# Patient Record
Sex: Male | Born: 1985 | Race: Black or African American | Hispanic: No | Marital: Married | Smoking: Never smoker
Health system: Southern US, Community
[De-identification: ages and names within clinical notes are randomized; demographics above are authoritative.]

---

## 2005-08-11 ENCOUNTER — Emergency Department (HOSPITAL_COMMUNITY): Admission: EM | Admit: 2005-08-11 | Discharge: 2005-08-11 | Payer: Self-pay | Admitting: Emergency Medicine

## 2017-05-09 ENCOUNTER — Telehealth: Payer: Self-pay | Admitting: Behavioral Health

## 2017-05-09 NOTE — Telephone Encounter (Signed)
Unable to reach patient at time of Pre-Visit Call.  Left message for patient to return call when available.    

## 2017-05-10 ENCOUNTER — Ambulatory Visit: Payer: Self-pay | Admitting: Family Medicine

## 2017-05-10 DIAGNOSIS — Z0289 Encounter for other administrative examinations: Secondary | ICD-10-CM

## 2019-03-18 ENCOUNTER — Ambulatory Visit (INDEPENDENT_AMBULATORY_CARE_PROVIDER_SITE_OTHER): Payer: Managed Care, Other (non HMO) | Admitting: Nurse Practitioner

## 2019-03-18 ENCOUNTER — Encounter: Payer: Self-pay | Admitting: Nurse Practitioner

## 2019-03-18 ENCOUNTER — Other Ambulatory Visit: Payer: Self-pay

## 2019-03-18 VITALS — Ht 70.0 in | Wt 325.0 lb

## 2019-03-18 DIAGNOSIS — R059 Cough, unspecified: Secondary | ICD-10-CM

## 2019-03-18 DIAGNOSIS — Z7189 Other specified counseling: Secondary | ICD-10-CM | POA: Diagnosis not present

## 2019-03-18 DIAGNOSIS — R05 Cough: Secondary | ICD-10-CM | POA: Diagnosis not present

## 2019-03-18 MED ORDER — LORATADINE 10 MG PO TABS: 10.0000 mg | ORAL_TABLET | Freq: Every day | ORAL | 0 refills | Status: AC | PRN

## 2019-03-18 NOTE — Progress Notes (Signed)
This visit type was conducted due to national recommendations for restrictions regarding the COVID-19 Pandemic (e.g. social distancing). This format is felt to be most appropriate for this patient at this time.  All issues noted in this document were discussed and addressed.  No physical exam was performed (except for noted visual exam findings with Video Visits).  Please refer to the patient's chart (MyChart message for video visits and phone note for telephone visits) for the patient's consent to telehealth for [facility].   Subjective:     Patient ID: Jeremy Beck , male    DOB: 14-Dec-1985 , 33 y.o.   MRN: [id number]  Virtual Visit via [email or web address] Note  I connected with Armandina Gemma on 03/18/19 at  4:00 PM EDT by telephone and verified that I am speaking with the correct person using two identifiers.   I discussed the limitations, risks, security and privacy concerns of performing an evaluation and management service by video and the availability of in person appointments. I also discussed with the patient that there may be a patient responsible charge related to this service. The patient expressed understanding and agreed to proceed.  Patient Location:  Home Provider Location: Home  Chief Complaint  Patient presents with  . Cough    History of Present Illness:   Installs cable.    Cough  This is a new problem. The current episode started in the past 7 days. The cough is productive of purulent sputum. Associated symptoms include chills. Pertinent negatives include no chest pain, fever, headaches, sore throat, shortness of breath or wheezing. The treatment provided mild relief. His past medical history is significant for environmental allergies. There is no history of asthma.     No past medical history on file.   Family History  Problem Relation Age of Onset  . Diabetes Mother     No current outpatient medications on file.   Not on File   Review of Systems  Constitutional:  Positive for chills. Negative for fatigue and fever.  HENT: Negative for sore throat.   Respiratory: Positive for cough. Negative for shortness of breath and wheezing.   Cardiovascular: Negative for chest pain, palpitations and leg swelling.  Gastrointestinal: Negative for diarrhea, nausea and vomiting.  Skin: Negative.   Allergic/Immunologic: Positive for environmental allergies.  Neurological: Negative for dizziness and headaches.     Today's Vitals   03/18/19 1610  Weight: (!) 325 lb (147.4 kg)  Height: 5\' 10"  (1.778 m)    Observations/Objective: No acute respiratory distress.  Skin appears dry. Unable to do full head to toe due to video virtual visit       Assessment and Plan: 1. Cough  Due to the current COVID-19 pandemic and him having an intermittent chills I feel he needs to quarantine for 14 days or until he has been without symptoms for at least 3 days - loratadine (CLARITIN) 10 MG tablet; Take 1 tablet (10 mg total) by mouth daily as needed for allergies.  Dispense: 90 tablet; Refill: 0   Follow Up Instructions:  Work note to be out for 14 days  Return call to office if symptoms worsen.  I discussed the assessment and treatment plan with the patient. The patient was provided an opportunity to ask questions and all were answered. The patient agreed with the plan and demonstrated an understanding of the instructions.   The patient was advised to call back or seek an in-person evaluation if the symptoms worsen or if the condition fails  to improve as anticipated.  COVID-19 Education: The signs and symptoms of COVID-19 were discussed with the patient and how to seek care for testing (follow up with PCP or arrange E-visit).  The importance of social distancing was discussed today.   Patient Risk:   After full review of this patients clinical status, I feel that they are at least moderate risk at this time.   I provided 12 minutes of non-face-to-face time during this  encounter.   Arnette FeltsJanece Ameliarose Shark, FNP

## 2020-09-01 ENCOUNTER — Other Ambulatory Visit: Payer: Self-pay

## 2020-09-01 DIAGNOSIS — Z20822 Contact with and (suspected) exposure to covid-19: Secondary | ICD-10-CM

## 2020-09-02 LAB — NOVEL CORONAVIRUS, NAA: SARS-CoV-2, NAA: NOT DETECTED

## 2020-09-02 LAB — SARS-COV-2, NAA 2 DAY TAT

## 2020-12-08 ENCOUNTER — Other Ambulatory Visit: Payer: Self-pay

## 2020-12-08 DIAGNOSIS — Z20822 Contact with and (suspected) exposure to covid-19: Secondary | ICD-10-CM

## 2020-12-10 LAB — NOVEL CORONAVIRUS, NAA: SARS-CoV-2, NAA: NOT DETECTED

## 2020-12-10 LAB — SARS-COV-2, NAA 2 DAY TAT

## 2021-05-30 ENCOUNTER — Other Ambulatory Visit: Payer: Self-pay

## 2021-05-30 ENCOUNTER — Encounter (HOSPITAL_BASED_OUTPATIENT_CLINIC_OR_DEPARTMENT_OTHER): Payer: Self-pay | Admitting: Emergency Medicine

## 2021-05-30 ENCOUNTER — Emergency Department (HOSPITAL_BASED_OUTPATIENT_CLINIC_OR_DEPARTMENT_OTHER)
Admission: EM | Admit: 2021-05-30 | Discharge: 2021-05-30 | Disposition: A | Discharge status: Home / Self Care | Payer: BC Managed Care – PPO | Attending: Emergency Medicine | Admitting: Emergency Medicine

## 2021-05-30 ENCOUNTER — Emergency Department (HOSPITAL_BASED_OUTPATIENT_CLINIC_OR_DEPARTMENT_OTHER): Payer: BC Managed Care – PPO

## 2021-05-30 DIAGNOSIS — R109 Unspecified abdominal pain: Secondary | ICD-10-CM | POA: Insufficient documentation

## 2021-05-30 DIAGNOSIS — R300 Dysuria: Secondary | ICD-10-CM | POA: Diagnosis not present

## 2021-05-30 DIAGNOSIS — M545 Low back pain, unspecified: Secondary | ICD-10-CM | POA: Insufficient documentation

## 2021-05-30 LAB — CBC WITH DIFFERENTIAL/PLATELET
Abs Immature Granulocytes: 0.04 10*3/uL (ref 0.00–0.07)
Basophils Absolute: 0 10*3/uL (ref 0.0–0.1)
Basophils Relative: 0 %
Eosinophils Absolute: 0.1 10*3/uL (ref 0.0–0.5)
Eosinophils Relative: 1 %
HCT: 39.1 % (ref 39.0–52.0)
Hemoglobin: 13.1 g/dL (ref 13.0–17.0)
Immature Granulocytes: 1 %
Lymphocytes Relative: 5 %
Lymphs Abs: 0.4 10*3/uL — ABNORMAL LOW (ref 0.7–4.0)
MCH: 29.2 pg (ref 26.0–34.0)
MCHC: 33.5 g/dL (ref 30.0–36.0)
MCV: 87.3 fL (ref 80.0–100.0)
Monocytes Absolute: 1.3 10*3/uL — ABNORMAL HIGH (ref 0.1–1.0)
Monocytes Relative: 15 %
Neutro Abs: 7 10*3/uL (ref 1.7–7.7)
Neutrophils Relative %: 78 %
Platelets: 214 10*3/uL (ref 150–400)
RBC: 4.48 MIL/uL (ref 4.22–5.81)
RDW: 14.2 % (ref 11.5–15.5)
WBC: 8.8 10*3/uL (ref 4.0–10.5)
nRBC: 0 % (ref 0.0–0.2)

## 2021-05-30 LAB — URINALYSIS, ROUTINE W REFLEX MICROSCOPIC
Bilirubin Urine: NEGATIVE
Glucose, UA: NEGATIVE mg/dL
Ketones, ur: NEGATIVE mg/dL
Leukocytes,Ua: NEGATIVE
Nitrite: NEGATIVE
Protein, ur: NEGATIVE mg/dL
Specific Gravity, Urine: 1.025 (ref 1.005–1.030)
pH: 6.5 (ref 5.0–8.0)

## 2021-05-30 LAB — COMPREHENSIVE METABOLIC PANEL
ALT: 30 U/L (ref 0–44)
AST: 30 U/L (ref 15–41)
Albumin: 4.1 g/dL (ref 3.5–5.0)
Alkaline Phosphatase: 43 U/L (ref 38–126)
Anion gap: 9 (ref 5–15)
BUN: 10 mg/dL (ref 6–20)
CO2: 26 mmol/L (ref 22–32)
Calcium: 8.6 mg/dL — ABNORMAL LOW (ref 8.9–10.3)
Chloride: 98 mmol/L (ref 98–111)
Creatinine, Ser: 1.04 mg/dL (ref 0.61–1.24)
GFR, Estimated: >60 mL/min (ref >60)
Glucose, Bld: 97 mg/dL (ref 70–99)
Potassium: 3.2 mmol/L — ABNORMAL LOW (ref 3.5–5.1)
Sodium: 133 mmol/L — ABNORMAL LOW (ref 135–145)
Total Bilirubin: 0.2 mg/dL — ABNORMAL LOW (ref 0.3–1.2)
Total Protein: 7.2 g/dL (ref 6.5–8.1)

## 2021-05-30 LAB — URINALYSIS, MICROSCOPIC (REFLEX)

## 2021-05-30 MED ORDER — SODIUM CHLORIDE 0.9 % IV BOLUS
500.0000 mL | Freq: Once | INTRAVENOUS | Status: AC
  Administered 2021-05-30: 500 mL via INTRAVENOUS

## 2021-05-30 MED ORDER — MORPHINE SULFATE (PF) 4 MG/ML IV SOLN
4.0000 mg | Freq: Once | INTRAVENOUS | Status: AC
Start: 2021-05-30 — End: 2021-05-30
  Administered 2021-05-30: 4 mg via INTRAVENOUS
  Filled 2021-05-30: qty 1

## 2021-05-30 MED ORDER — POTASSIUM CHLORIDE CRYS ER 20 MEQ PO TBCR
40.0000 meq | EXTENDED_RELEASE_TABLET | Freq: Once | ORAL | Status: AC
  Administered 2021-05-30: 40 meq via ORAL
  Filled 2021-05-30: qty 2

## 2021-05-30 NOTE — ED Triage Notes (Signed)
Pt presents to ED Pov. Pt c/o L flank pain and dysuria. Pt reports that he has not been able to fully empty bladder.

## 2021-05-30 NOTE — ED Notes (Signed)
Unable to provide urine specimen at this time. Strainer at bedside.

## 2021-05-30 NOTE — ED Provider Notes (Signed)
[facility] EMERGENCY DEPARTMENT Provider Note   CSN: [id number] Arrival date & time: 05/30/21  1640     History Chief Complaint  Patient presents with   Flank Pain    Jeremy Beck is a 35 y.o. male.  HPI Patient is a 35 year old male with no pertinent medical history presents to the emergency department with sudden onset left flank pain that started about 12 hours ago.  Symptoms have been progressively worsening throughout the day.  Reports associated left-sided abdominal pain.  No nausea, vomiting, diarrhea, or fevers.  He does note some mild dysuria as well as difficulty with urination since his symptoms started.  No other complaints.    History reviewed. No pertinent past medical history.  There are no problems to display for this patient.   History reviewed. No pertinent surgical history.     Family History  Problem Relation Age of Onset   Diabetes Mother     Social History   Tobacco Use   Smoking status: Never   Smokeless tobacco: Never  Substance Use Topics   Alcohol use: Yes    Comment: occasionally    Drug use: Never    Home Medications Prior to Admission medications   Medication Sig Start Date End Date Taking? Authorizing Provider  loratadine (CLARITIN) 10 MG tablet Take 1 tablet (10 mg total) by mouth daily as needed for allergies. 03/18/19   Arnette Felts, FNP    Allergies    Patient has no known allergies.  Review of Systems   Review of Systems  All other systems reviewed and are negative. Ten systems reviewed and are negative for acute change, except as noted in the HPI.   Physical Exam Updated Vital Signs BP 136/72 (BP Location: Right Arm)   Pulse 77   Temp 99.8 F (37.7 C) (Oral)   Resp 18   Ht 5\' 10"  (1.778 m)   Wt 126.1 kg   SpO2 98%   BMI 39.89 kg/m   Physical Exam Vitals and nursing note reviewed.  Constitutional:      General: He is not in acute distress.    Appearance: Normal appearance. He is not  ill-appearing, toxic-appearing or diaphoretic.  HENT:     Head: Normocephalic and atraumatic.     Right Ear: External ear normal.     Left Ear: External ear normal.     Nose: Nose normal.     Mouth/Throat:     Mouth: Mucous membranes are moist.     Pharynx: Oropharynx is clear. No oropharyngeal exudate or posterior oropharyngeal erythema.  Eyes:     Extraocular Movements: Extraocular movements intact.  Cardiovascular:     Rate and Rhythm: Normal rate and regular rhythm.     Pulses: Normal pulses.     Heart sounds: Normal heart sounds. No murmur heard.   No friction rub. No gallop.  Pulmonary:     Effort: Pulmonary effort is normal. No respiratory distress.     Breath sounds: Normal breath sounds. No stridor. No wheezing, rhonchi or rales.  Abdominal:     General: Abdomen is flat.     Palpations: Abdomen is soft.     Tenderness: There is abdominal tenderness. There is left CVA tenderness. There is no right CVA tenderness.     Comments: Protuberant abdomen that is soft.  Mild diffuse left lateral abdominal tenderness with additional moderate left CVA tenderness.  No right CVA tenderness.  Musculoskeletal:        General: Normal range of motion.  Cervical back: Normal range of motion and neck supple. No tenderness.  Skin:    General: Skin is warm and dry.  Neurological:     General: No focal deficit present.     Mental Status: He is alert and oriented to person, place, and time.  Psychiatric:        Mood and Affect: Mood normal.        Behavior: Behavior normal.    ED Results / Procedures / Treatments   Labs (all labs ordered are listed, but only abnormal results are displayed) Labs Reviewed  URINALYSIS, ROUTINE W REFLEX MICROSCOPIC - Abnormal; Notable for the following components:      Result Value   Hgb urine dipstick SMALL (*)    All other components within normal limits  COMPREHENSIVE METABOLIC PANEL - Abnormal; Notable for the following components:   Sodium 133 (*)     Potassium 3.2 (*)    Calcium 8.6 (*)    Total Bilirubin 0.2 (*)    All other components within normal limits  CBC WITH DIFFERENTIAL/PLATELET - Abnormal; Notable for the following components:   Lymphs Abs 0.4 (*)    Monocytes Absolute 1.3 (*)    All other components within normal limits  URINALYSIS, MICROSCOPIC (REFLEX) - Abnormal; Notable for the following components:   Bacteria, UA RARE (*)    All other components within normal limits  URINE CULTURE   EKG None  Radiology CT RENAL STONE STUDY  Result Date: 05/30/2021 CLINICAL DATA:  Left flank pain and dysuria. EXAM: CT ABDOMEN AND PELVIS WITHOUT CONTRAST TECHNIQUE: Multidetector CT imaging of the abdomen and pelvis was performed following the standard protocol without IV contrast. COMPARISON:  None. FINDINGS: Lower chest: No acute findings. Hepatobiliary: No mass visualized on this unenhanced exam. Gallbladder is unremarkable. No evidence of biliary ductal dilatation. Pancreas: No mass or inflammatory process visualized on this unenhanced exam. Spleen:  Within normal limits in size. Adrenals/Urinary tract: No evidence of urolithiasis or hydronephrosis. Unremarkable unopacified urinary bladder. Stomach/Bowel: No evidence of obstruction, inflammatory process, or abnormal fluid collections. Normal appendix visualized. Vascular/Lymphatic: No pathologically enlarged lymph nodes identified. No evidence of abdominal aortic aneurysm. Reproductive:  No mass or other significant abnormality. Other:  None. Musculoskeletal:  No suspicious bone lesions identified. IMPRESSION: Negative. No evidence of urolithiasis, hydronephrosis, or other acute findings. Electronically Signed   By: Danae Orleans M.D.   On: 05/30/2021 18:23    Procedures Procedures   Medications Ordered in ED Medications  morphine 4 MG/ML injection 4 mg (4 mg Intravenous Given 05/30/21 1735)  potassium chloride SA (KLOR-CON) CR tablet 40 mEq (40 mEq Oral Given 05/30/21 1914)  sodium  chloride 0.9 % bolus 500 mL ( Intravenous Stopped 05/30/21 2044)    ED Course  I have reviewed the triage vital signs and the nursing notes.  Pertinent labs & imaging results that were available during my care of the patient were reviewed by me and considered in my medical decision making (see chart for details).  Clinical Course as of 05/30/21 2151  Mon May 30, 2021  2055 Patient reassessed.  Notes significant resolution of his symptoms.  Patient now states that when his pain worsens it feels as though he has a muscle spasm in his back. [LJ]    Clinical Course User Index [LJ] Placido Sou, PA-C   MDM Rules/Calculators/A&P  Pt is a 35 y.o. male who presents to the emergency department with left low back and flank pain that started this morning.  Labs: CBC with lymphocytes of 0.4 and monocytes of 1.3. CMP with a sodium of 133, potassium of 3.2, calcium of 8.4, total bilirubin of 0.2. UA with small hemoglobin and rare bacteria.  Imaging: CT renal stone study was negative.  No evidence of urolithiasis, hydronephrosis, or other acute findings.  I, Placido Sou, PA-C, personally reviewed and evaluated these images and lab results as part of my medical decision-making.  Unsure of the source of the patient's symptoms.  He was given a dose of morphine upon arrival and notes significant resolution of his symptoms.  Lab work and imaging is all reassuring.  He does have a small amount of hemoglobin in his urine.  Possibly a passed kidney stone.  Likely musculoskeletal.  After speaking further he notes that it is more of a pulling sensation in his back when it does worsen.  Feel the patient is stable for discharge at this time and he is agreeable.  Recommended he continue to take Tylenol and ibuprofen for his symptoms and to continue to monitor them closely.  If they worsen, he knows to come back to the emergency department for reevaluation.  His questions were  answered and he was amicable at the time of discharge.  Note: Portions of this report may have been transcribed using voice recognition software. Every effort was made to ensure accuracy; however, inadvertent computerized transcription errors may be present.    Final Clinical Impression(s) / ED Diagnoses Final diagnoses:  Flank pain   Rx / DC Orders ED Discharge Orders     None        Placido Sou, PA-C 05/30/21 2151    Melene Plan, DO 05/30/21 2250

## 2021-05-30 NOTE — Discharge Instructions (Addendum)
I recommend a combination of tylenol and ibuprofen for management of your pain. You can take a low dose of both at the same time. I recommend 500 mg of Tylenol combined with 600 mg of ibuprofen. This is one maximum strength Tylenol and three regular ibuprofen. You can take these 2-3 times for day for your pain. Please try to take these medications with a small amount of food as well to prevent upsetting your stomach.  Please continue to closely monitor your symptoms. If you develop any new or worsening symptoms please come back to the emergency department for reevaluation.

## 2021-06-02 LAB — URINE CULTURE: Culture: NO GROWTH

## 2021-09-03 IMAGING — CT CT RENAL STONE PROTOCOL
2 of 4 series · 17 of 46 positions shown, 19 images · non-contrast
Comparison: None.

CLINICAL DATA: Left flank pain and dysuria.

EXAM:
CT ABDOMEN AND PELVIS WITHOUT CONTRAST
TECHNIQUE: Multidetector CT imaging of the abdomen and pelvis was performed
following the standard protocol without IV contrast.

[Series 2: axial st · axial · 0.98mm/px · z∈[-616,-156]mm · 14 of 101 slices shown, 16 images]
[im 5/101  soft-tissue]
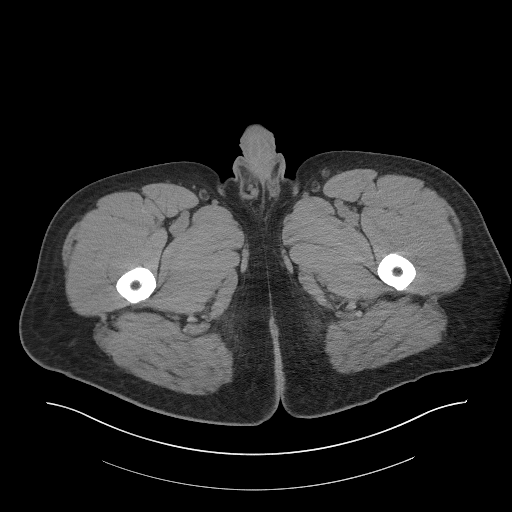
[im 5/101  bone]
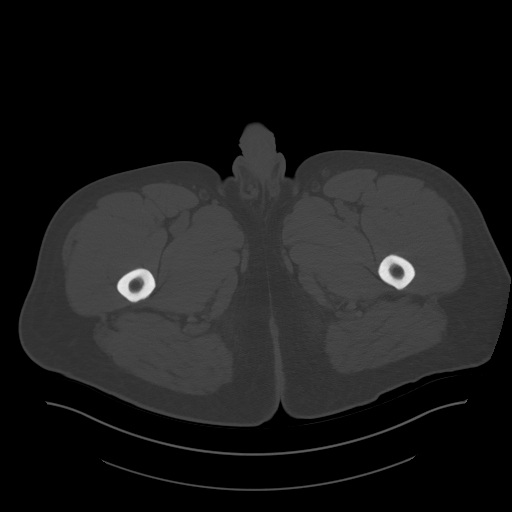
[im 13/101  soft-tissue]
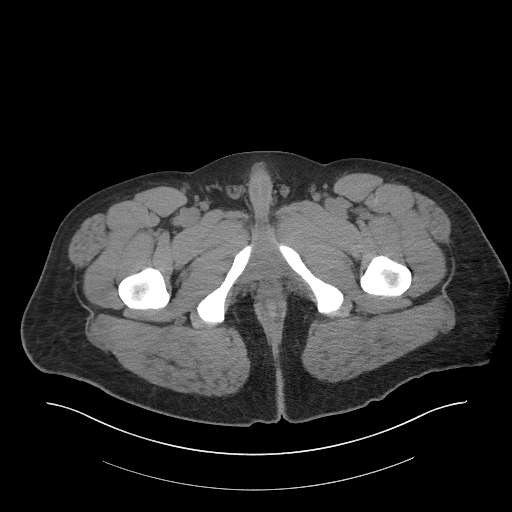
[im 21/101  soft-tissue]
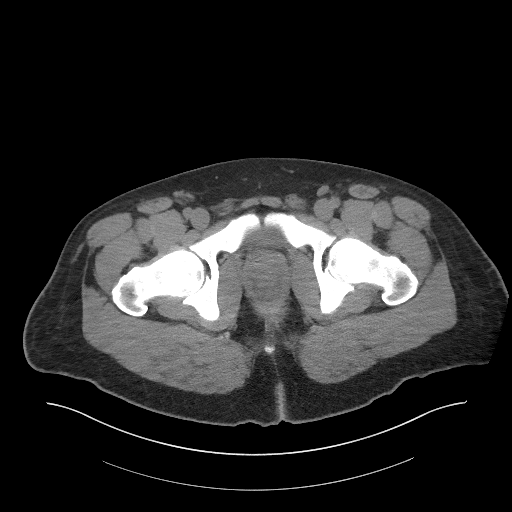
[im 29/101  soft-tissue]
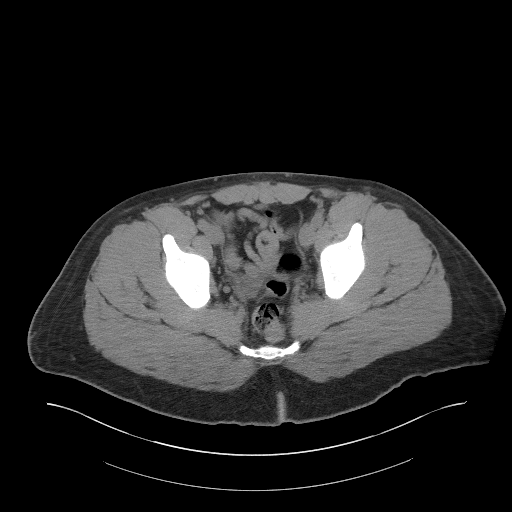
[im 33/101  soft-tissue]
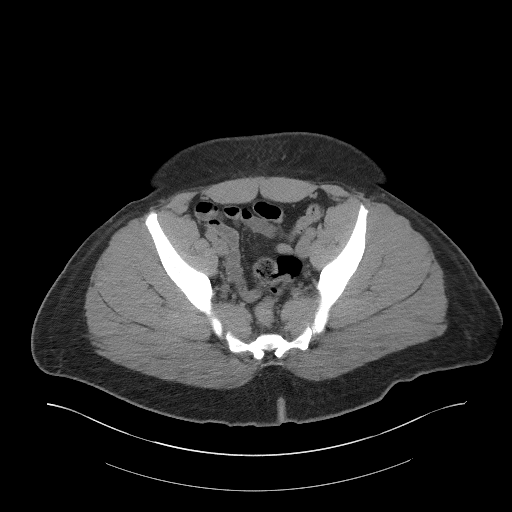
[im 41/101  soft-tissue]
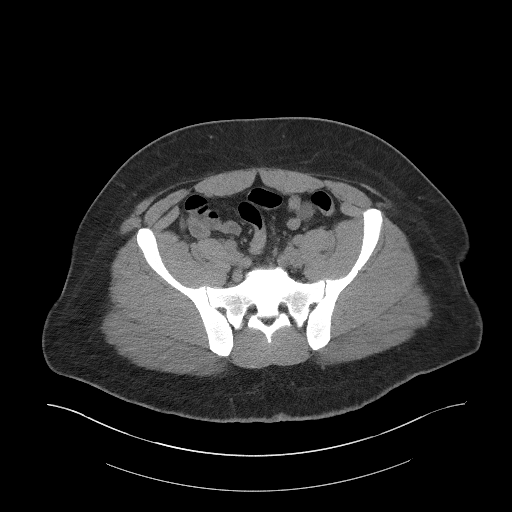
[im 49/101  soft-tissue]
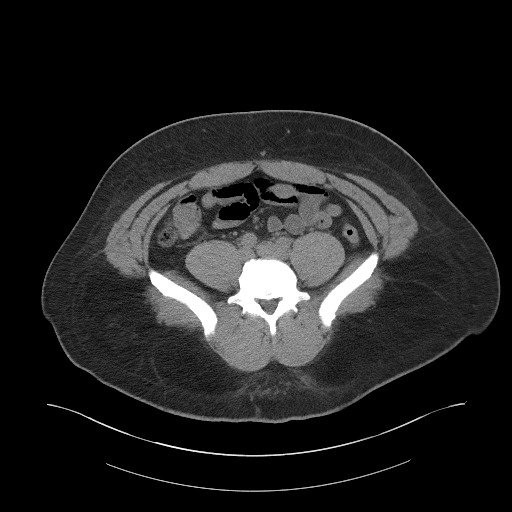
[im 53/101  soft-tissue]
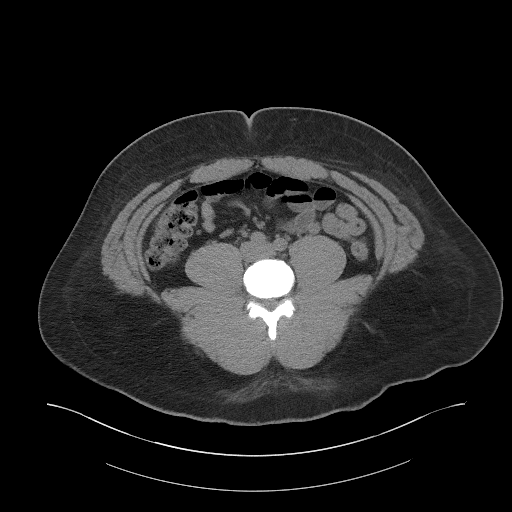
[im 61/101  soft-tissue]
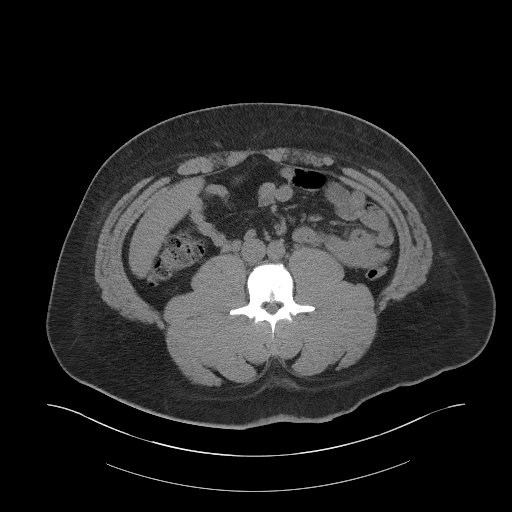
[im 61/101  bone]
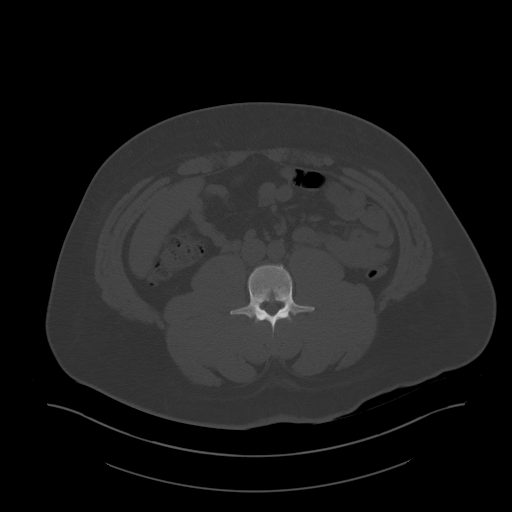
[im 69/101  soft-tissue]
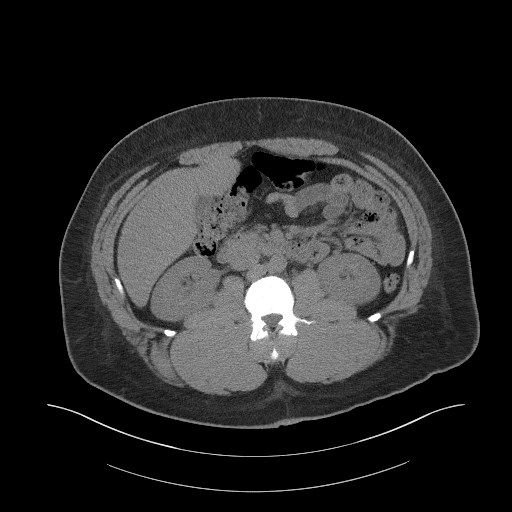
[im 77/101  soft-tissue]
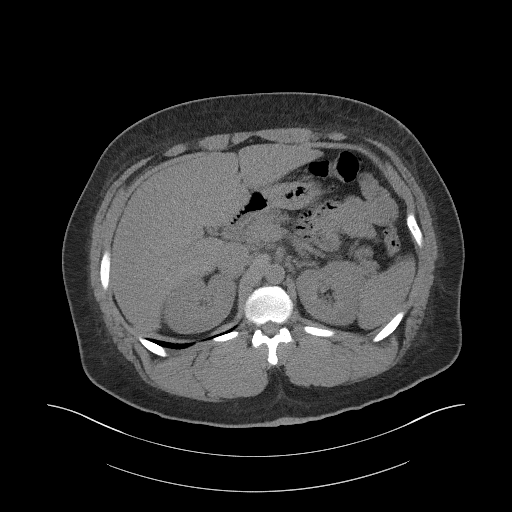
[im 81/101  soft-tissue]
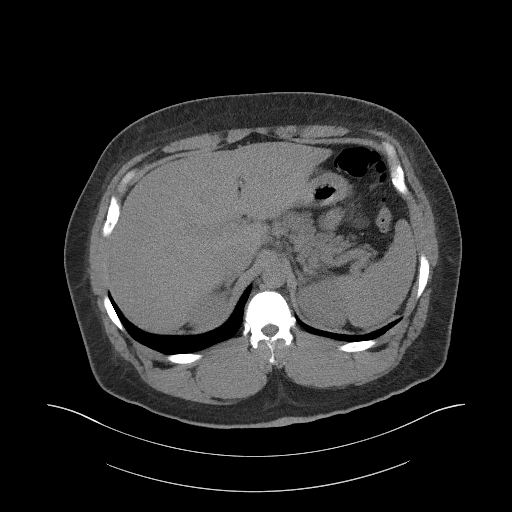
[im 89/101  soft-tissue]
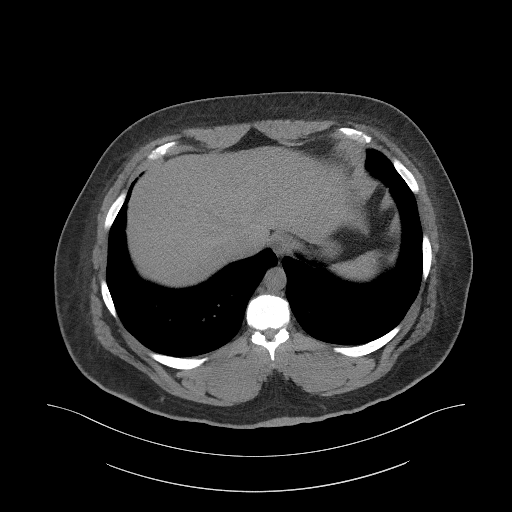
[im 97/101  soft-tissue]
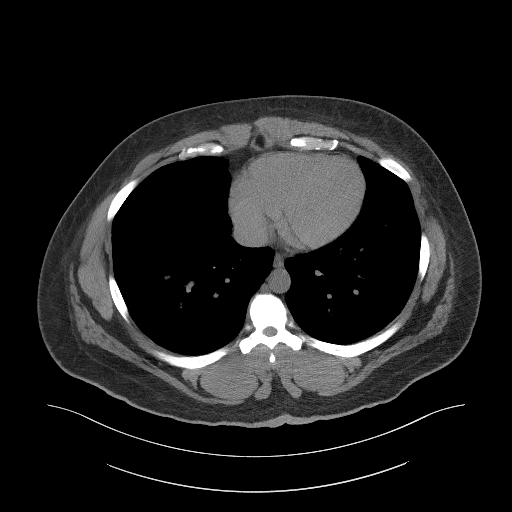

[Series 5: coronal st · coronal · 0.94mm/px · 3 of 101 slices shown]
[im 34/101  soft-tissue]
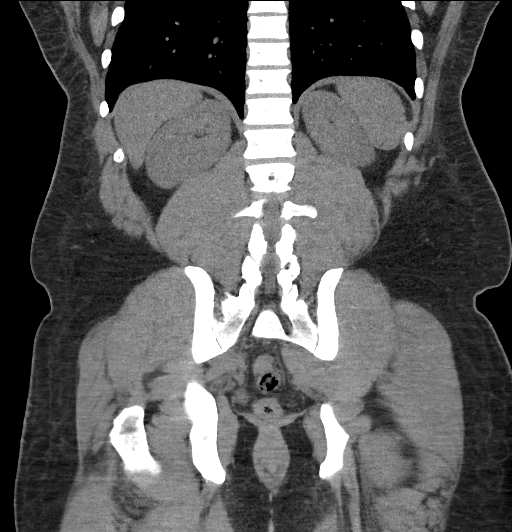
[im 45/101  soft-tissue]
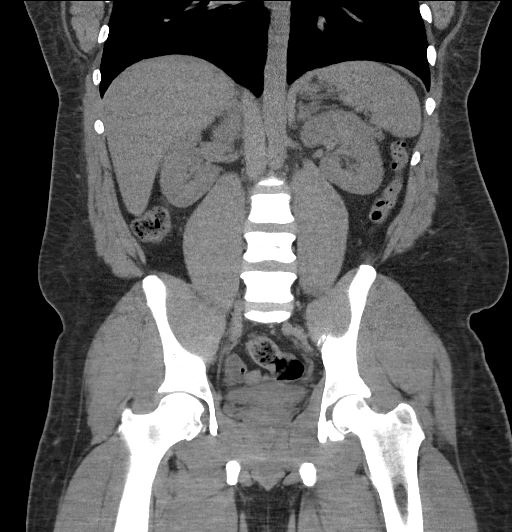
[im 56/101  soft-tissue]
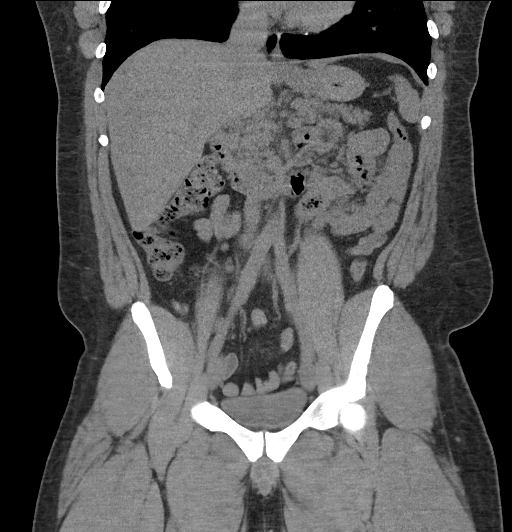

[17 of 46 positions shown; findings below may reference images not displayed]

FINDINGS: Lower chest: No acute findings.

Hepatobiliary: No mass visualized on this unenhanced exam.
Gallbladder is unremarkable. No evidence of biliary ductal
dilatation.

Pancreas: No mass or inflammatory process visualized on this
unenhanced exam.

Spleen:  Within normal limits in size.

Adrenals/Urinary tract: No evidence of urolithiasis or
hydronephrosis. Unremarkable unopacified urinary bladder.

Stomach/Bowel: No evidence of obstruction, inflammatory process, or
abnormal fluid collections. Normal appendix visualized.

Vascular/Lymphatic: No pathologically enlarged lymph nodes
identified. No evidence of abdominal aortic aneurysm.

Reproductive:  No mass or other significant abnormality.

Other:  None.

Musculoskeletal:  No suspicious bone lesions identified.
IMPRESSION: Negative. No evidence of urolithiasis, hydronephrosis, or other
acute findings.
